# Patient Record
Sex: Female | Born: 2000 | Race: Black or African American | Hispanic: No | Marital: Single | Smoking: Former smoker
Health system: Southern US, Community
[De-identification: ages and names within clinical notes are randomized; demographics above are authoritative.]

## PROBLEM LIST (undated history)

## (undated) DIAGNOSIS — I10 Essential (primary) hypertension: Secondary | ICD-10-CM

## (undated) DIAGNOSIS — E119 Type 2 diabetes mellitus without complications: Secondary | ICD-10-CM

---
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
## 2018-10-13 ENCOUNTER — Encounter: Payer: Self-pay | Admitting: Emergency Medicine

## 2018-10-13 ENCOUNTER — Emergency Department: Payer: Self-pay

## 2018-10-13 ENCOUNTER — Other Ambulatory Visit: Payer: Self-pay

## 2018-10-13 ENCOUNTER — Emergency Department
Admission: EM | Admit: 2018-10-13 | Discharge: 2018-10-13 | Disposition: A | Discharge status: Home / Self Care | Payer: Self-pay | Attending: Emergency Medicine | Admitting: Emergency Medicine

## 2018-10-13 DIAGNOSIS — J45909 Unspecified asthma, uncomplicated: Secondary | ICD-10-CM | POA: Insufficient documentation

## 2018-10-13 DIAGNOSIS — Z794 Long term (current) use of insulin: Secondary | ICD-10-CM | POA: Insufficient documentation

## 2018-10-13 DIAGNOSIS — Z79899 Other long term (current) drug therapy: Secondary | ICD-10-CM | POA: Insufficient documentation

## 2018-10-13 DIAGNOSIS — E109 Type 1 diabetes mellitus without complications: Secondary | ICD-10-CM | POA: Insufficient documentation

## 2018-10-13 DIAGNOSIS — R0789 Other chest pain: Secondary | ICD-10-CM | POA: Insufficient documentation

## 2018-10-13 DIAGNOSIS — I10 Essential (primary) hypertension: Secondary | ICD-10-CM | POA: Insufficient documentation

## 2018-10-13 DIAGNOSIS — F17290 Nicotine dependence, other tobacco product, uncomplicated: Secondary | ICD-10-CM | POA: Insufficient documentation

## 2018-10-13 LAB — BASIC METABOLIC PANEL
ANION GAP: 9 (ref 5–15)
BUN: 13 mg/dL (ref 6–20)
CALCIUM: 9.7 mg/dL (ref 8.9–10.3)
CO2: 27 mmol/L (ref 22–32)
Chloride: 97 mmol/L — ABNORMAL LOW (ref 98–111)
Creatinine, Ser: 0.47 mg/dL (ref 0.44–1.00)
GLUCOSE: 311 mg/dL — AB (ref 70–99)
POTASSIUM: 4.1 mmol/L (ref 3.5–5.1)
SODIUM: 133 mmol/L — AB (ref 135–145)

## 2018-10-13 LAB — CBC
HCT: 45.5 % (ref 36.0–46.0)
HEMOGLOBIN: 14.7 g/dL (ref 12.0–15.0)
MCH: 25.7 pg — ABNORMAL LOW (ref 26.0–34.0)
MCHC: 32.3 g/dL (ref 30.0–36.0)
MCV: 79.4 fL — ABNORMAL LOW (ref 80.0–100.0)
NRBC: 0 % (ref 0.0–0.2)
Platelets: 400 10*3/uL (ref 150–400)
RBC: 5.73 MIL/uL — AB (ref 3.87–5.11)
RDW: 11.7 % (ref 11.5–15.5)
WBC: 5.4 10*3/uL (ref 4.0–10.5)

## 2018-10-13 LAB — POCT PREGNANCY, URINE: PREG TEST UR: NEGATIVE

## 2018-10-13 LAB — TROPONIN I

## 2018-10-13 MED ORDER — NAPROXEN 500 MG PO TABS: 500.0000 mg | ORAL_TABLET | Freq: Two times a day (BID) | ORAL | 0 refills | Status: AC

## 2018-10-13 NOTE — ED Triage Notes (Signed)
First Nurse Note:  C/O intermittent chest pain with inspiration and intermittent heart palpitations x 3-4 days.  Patient is AAOx3.  Skin warm and dry. NAD.  No SOB/ DOE.

## 2018-10-13 NOTE — ED Triage Notes (Signed)
Pt presents with cp x 4 days, intermittent, accompanied by sob. Denies n/v. Pt states she thought it would go away, but it kept returning. Pt alert & oriented, nad noted.

## 2018-10-13 NOTE — ED Provider Notes (Signed)
[facility] Emergency Department Provider Note  ____________________________________________  Time seen: Approximately 10:07 AM  I have reviewed the triage vital signs and the nursing notes.   HISTORY  Chief Complaint Chest Pain    HPI Colleen Murray is a 17 y.o. female with a history of hypertension and diabetes who  complains of right-sided chest pain for the past 4 days.  Intermittent, lasting about a minute at a time.  Described as sharp.  Nonradiating.  No aggravating or alleviating factors.  Not exertional, not pleuritic.  No associated shortness of breath diaphoresis or vomiting.  No dizziness or syncope.  No palpitations.  Has not tried anything for the pain.  No recent illness cough cold congestion sore throat runny nose.  Compliant with all her medications.  Follows up with endocrinology for her chronic diabetes care.  No recent travel trauma hospitalization or surgery.   Past Medical History:  Diagnosis Date  . Diabetes mellitus without complication (HCC)   . Hypertension      Patient Active Problem List   Diagnosis Date Noted  . Asthma, exercise induced 05/18/2015  . Type 2 diabetes mellitus with hyperglycemia (HCC) 05/18/2015  . Benign hypertension 05/18/2015     History reviewed. No pertinent surgical history.   Prior to Admission medications   Medication Sig Start Date End Date Taking? Authorizing Provider  albuterol (PROVENTIL HFA;VENTOLIN HFA) 108 (90 BASE) MCG/ACT inhaler Inhale 2 puffs into the lungs every 6 (six) hours as needed (before activity). 05/18/15  Yes Alba Cory, MD  GLUCAGON EMERGENCY 1 MG injection as needed. 03/01/15  Yes [provider]  hydrochlorothiazide (HYDRODIURIL) 12.5 MG tablet Take 1 tablet (12.5 mg total) by mouth 2 (two) times daily. 05/18/15  Yes Sowles, Danna Hefty, MD  metFORMIN (GLUCOPHAGE) 500 MG tablet Take 500 mg by mouth 2 (two) times daily with a meal. 04/25/15  Yes [provider]  NOVOLOG MIX 70/30 FLEXPEN (70-30) 100 UNIT/ML FlexPen Inject 25 Units as directed daily. And 45units at bedtime 02/28/15  Yes [provider]  ACCU-CHEK AVIVA PLUS test strip  02/28/15   [provider]  naproxen (NAPROSYN) 500 MG tablet Take 1 tablet (500 mg total) by mouth 2 (two) times daily with a meal. 10/13/18   Sharman Cheek, MD     Allergies Patient has no known allergies.   History reviewed. No pertinent family history.  Social History Social History   Tobacco Use  . Smoking status: Current Every Day Smoker    Types: Cigars  . Smokeless tobacco: Never Used  Substance Use Topics  . Alcohol use: Yes    Alcohol/week: 0.0 standard drinks    Comment: approx 2 shots per week  . Drug use: Yes    Frequency: 7.0 times per week    Types: Marijuana    Review of Systems  Constitutional:   No fever or chills.  ENT:   No sore throat. No rhinorrhea. Cardiovascular:   Positive as above chest pain without syncope. Respiratory:   No dyspnea or cough. Gastrointestinal:   Negative for abdominal pain, vomiting and diarrhea.  Musculoskeletal:   Negative for focal pain or swelling All other systems reviewed and are negative except as documented above in ROS and HPI.  ____________________________________________   PHYSICAL EXAM:  VITAL SIGNS: ED Triage Vitals  Enc Vitals Group     BP 10/13/18 0816 (!) 147/101     Pulse Rate 10/13/18 0816 96     Resp 10/13/18 0816 18  Temp 10/13/18 0816 98.4 F (36.9 C)     Temp Source 10/13/18 0816 Oral     SpO2 10/13/18 0816 98 %     Weight 10/13/18 0811 180 lb (81.6 kg)     Height 10/13/18 0811 5\' 3"  (1.6 m)     Head Circumference --      Peak Flow --      Pain Score 10/13/18 0811 7     Pain Loc --      Pain Edu? --      Excl. in GC? --     Vital signs reviewed, nursing assessments reviewed.   Constitutional:   Alert and oriented. Non-toxic appearance. Eyes:   Conjunctivae are normal. EOMI. PERRL. ENT       Head:   Normocephalic and atraumatic.      Nose:   No congestion/rhinnorhea.       Mouth/Throat:   MMM, no pharyngeal erythema. No peritonsillar mass.       Neck:   No meningismus. Full ROM. Hematological/Lymphatic/Immunilogical:   No cervical lymphadenopathy. Cardiovascular:   RRR. Symmetric bilateral radial and DP pulses.  No murmurs. Cap refill less than 2 seconds. Respiratory:   Normal respiratory effort without tachypnea/retractions. Breath sounds are clear and equal bilaterally. No wheezes/rales/rhonchi.  No inducible wheezing with FEV1 maneuver. Gastrointestinal:   Soft and nontender. Non distended. There is no CVA tenderness.  No rebound, rigidity, or guarding. Musculoskeletal:   Normal range of motion in all extremities. No joint effusions.  No lower extremity tenderness.  No edema.  Chest wall nontender. Neurologic:   Normal speech and language.  Motor grossly intact. No acute focal neurologic deficits are appreciated.  Skin:    Skin is warm, dry and intact. No rash noted.  No petechiae, purpura, or bullae.  ____________________________________________    LABS (pertinent positives/negatives) (all labs ordered are listed, but only abnormal results are displayed) Labs Reviewed  BASIC METABOLIC PANEL - Abnormal; Notable for the following components:      Result Value   Sodium 133 (*)    Chloride 97 (*)    Glucose, Bld 311 (*)    All other components within normal limits  CBC - Abnormal; Notable for the following components:   RBC 5.73 (*)    MCV 79.4 (*)    MCH 25.7 (*)    All other components within normal limits  TROPONIN I  POC URINE PREG, ED  POCT PREGNANCY, URINE   ____________________________________________   EKG  Interpreted by me Normal sinus rhythm rate of 95, normal axis intervals.  Poor R wave progression.  Normal ST segments and T waves.  ____________________________________________    RADIOLOGY  Dg Chest 2 View  Result Date:  10/13/2018 CLINICAL DATA:  Chest pain and cough EXAM: CHEST - 2 VIEW COMPARISON:  May 31, 2014 FINDINGS: The lungs are clear. The heart size and pulmonary vascularity are normal. No adenopathy. No bone lesions. No pneumothorax. IMPRESSION: No edema or consolidation. Electronically Signed   By: Bretta BangWilliam  Woodruff III M.D.   On: 10/13/2018 09:00    ____________________________________________   PROCEDURES Procedures  ____________________________________________  DIFFERENTIAL DIAGNOSIS   Atypical chest pain, chest wall pain, bronchitis  CLINICAL IMPRESSION / ASSESSMENT AND PLAN / ED COURSE  Pertinent labs & imaging results that were available during my care of the patient were reviewed by me and considered in my medical decision making (see chart for details).    Patient presents with atypical chest pain.Considering the patient's symptoms, medical history,  and physical examination today, I have low suspicion for ACS, PE, TAD, pneumothorax, carditis, mediastinitis, pneumonia, CHF, or sepsis.  EKG is unremarkable.  With her medical history, labs are obtained for further risk stratification.  Troponin is negative.  Chest x-ray is also unremarkable without evidence of pneumothorax or pneumonia.  Stable for discharge home and outpatient follow-up with primary care at this time.  Continue medications.  Does not appear to be dehydrated.  She is nontoxic.  Tolerating oral intake.  Clinical Course as of Oct 13 1006  Wed Oct 13, 2018  1610 Diabetes, on medications.  No evidence of acidosis.  Glucose(!): 311 [PS]    Clinical Course User Index [PS] Sharman Cheek, MD     ____________________________________________   FINAL CLINICAL IMPRESSION(S) / ED DIAGNOSES    Final diagnoses:  Atypical chest pain  Type 1 diabetes mellitus without complication [facility])     ED Discharge Orders         Ordered    naproxen (NAPROSYN) 500 MG tablet  2 times daily with meals     10/13/18 1007           Portions of this note were generated with dragon dictation software. Dictation errors may occur despite best attempts at proofreading.    Sharman Cheek, MD 10/13/18 1012

## 2018-10-13 NOTE — Discharge Instructions (Signed)
Your EKG, chest x-ray, and labs today were all okay.  It is unclear what is causing your chest pain but it does not appear to be anything serious.  Please follow-up with your doctor to continue monitoring the symptoms.

## 2019-06-13 ENCOUNTER — Ambulatory Visit: Payer: Self-pay

## 2019-09-07 IMAGING — CR DG CHEST 2V
2 series · 2 of 2 positions shown · non-contrast
Comparison: May 31, 2014

CLINICAL DATA: Chest pain and cough

EXAM:
CHEST - 2 VIEW

[chest pa]
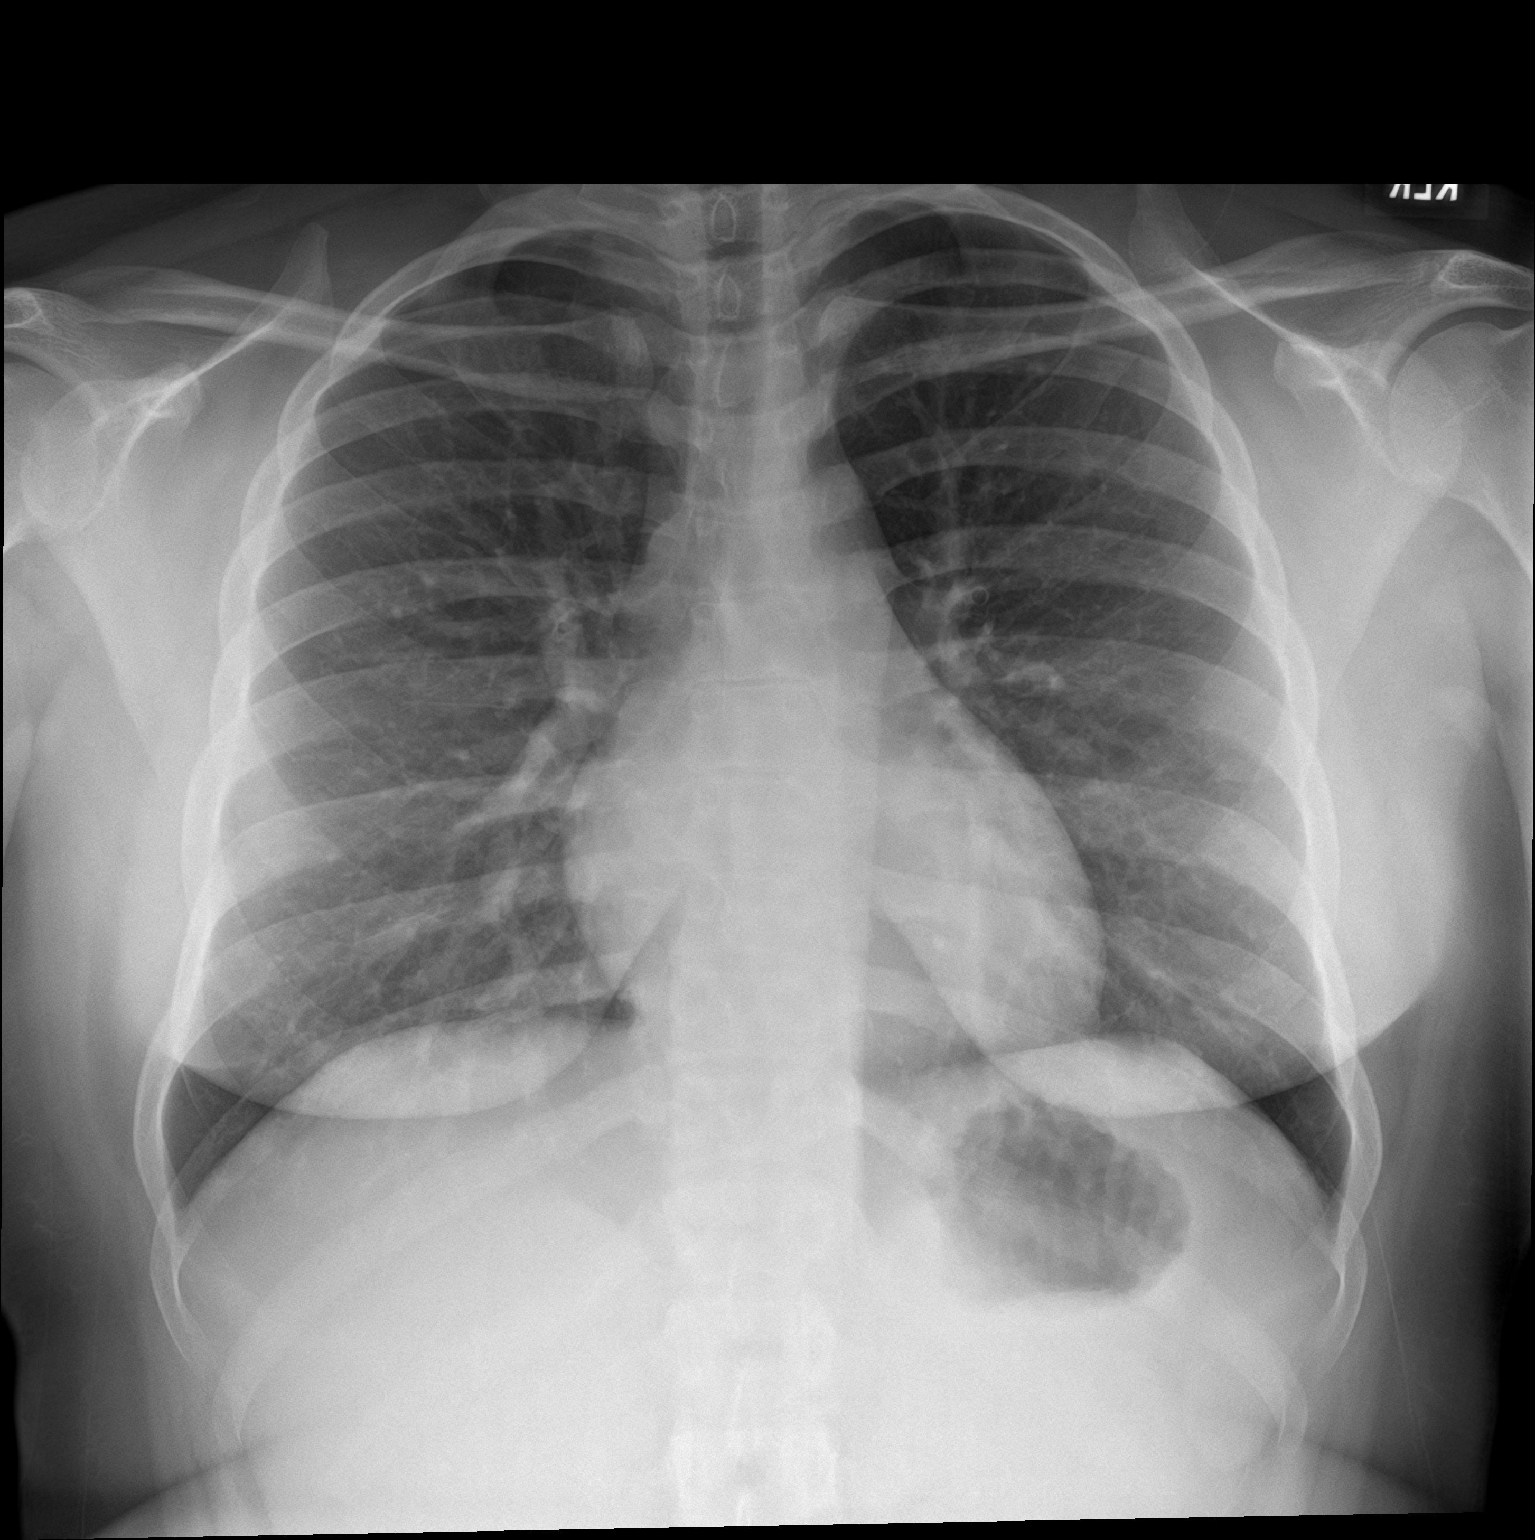

[chest lat]
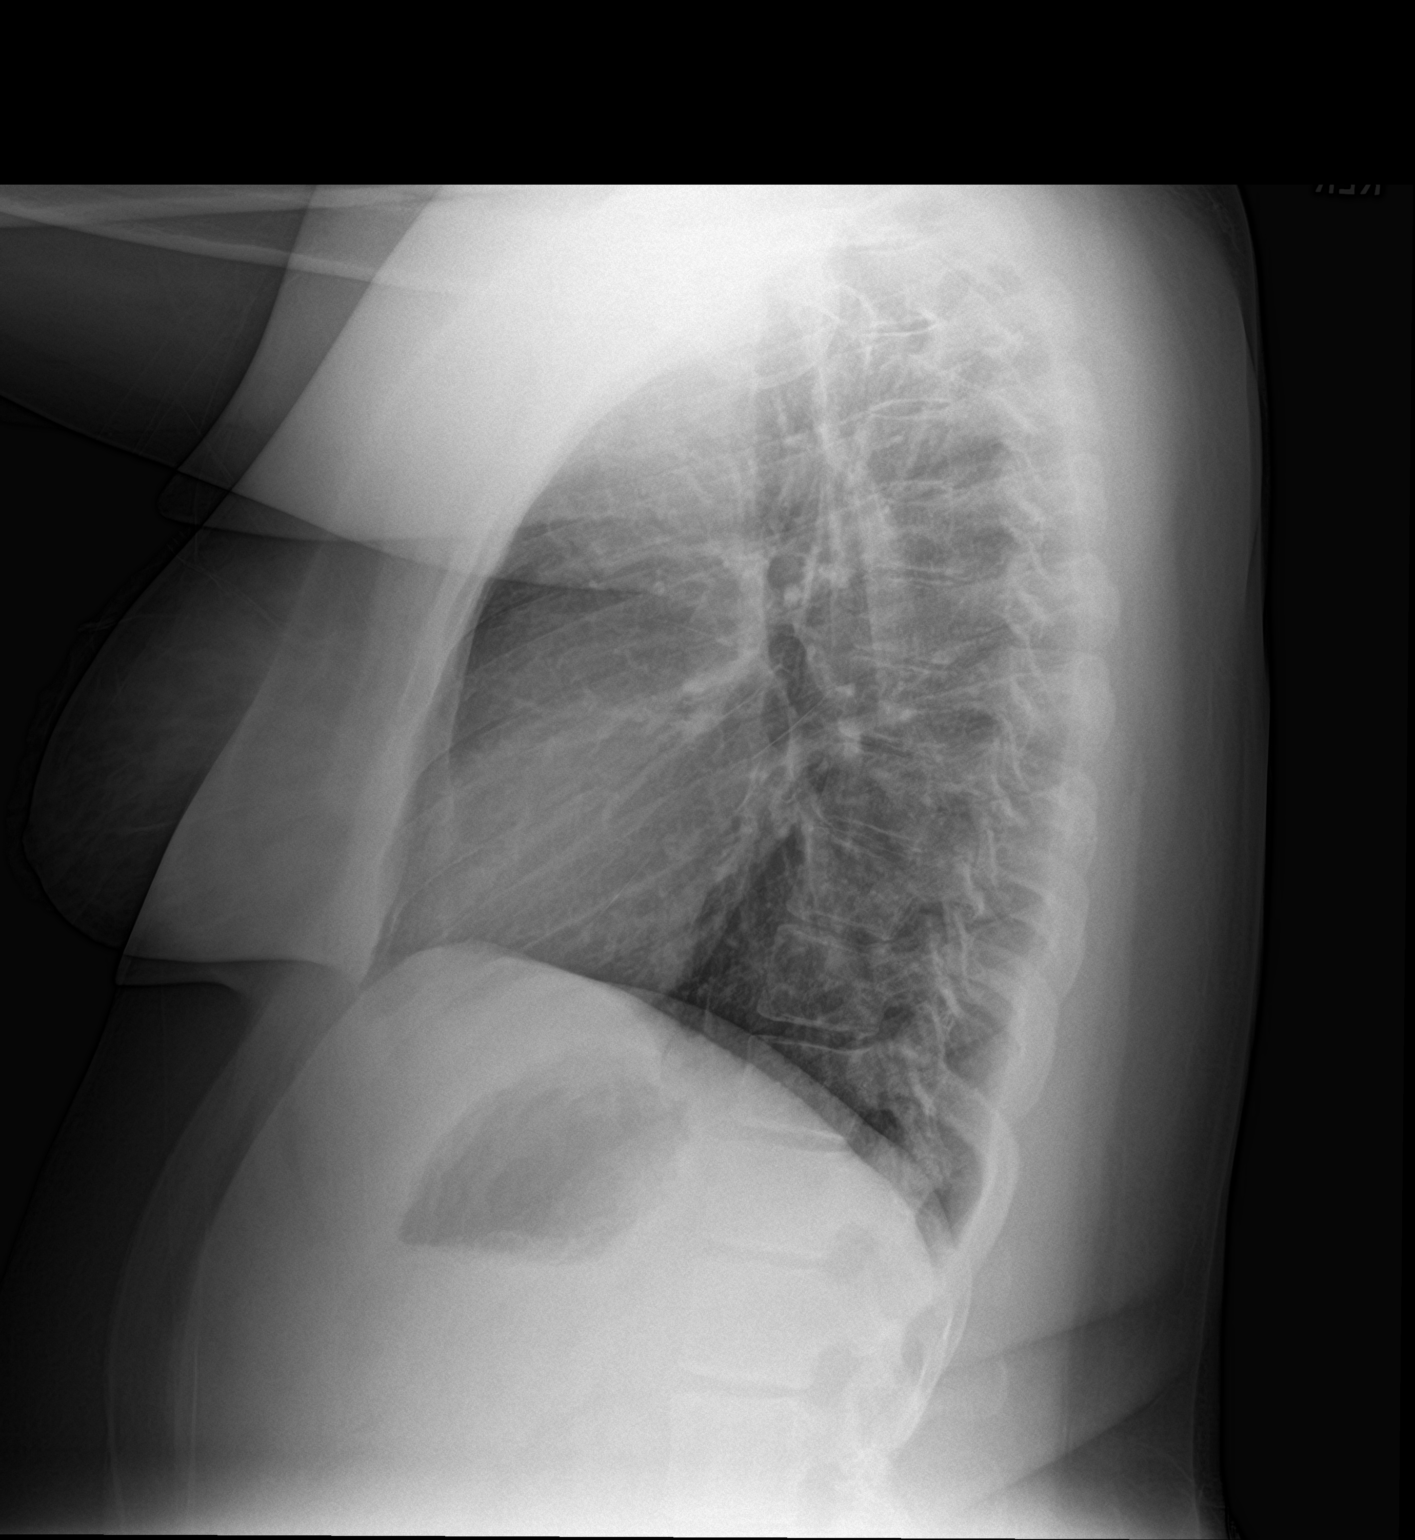

[2 of 2 positions shown; findings below may reference images not displayed]

FINDINGS: The lungs are clear. The heart size and pulmonary vascularity are
normal. No adenopathy. No bone lesions. No pneumothorax.
IMPRESSION: No edema or consolidation.

## 2021-10-19 ENCOUNTER — Other Ambulatory Visit: Payer: Self-pay

## 2021-10-19 ENCOUNTER — Encounter: Payer: Self-pay | Admitting: Intensive Care

## 2021-10-19 DIAGNOSIS — Z5321 Procedure and treatment not carried out due to patient leaving prior to being seen by health care provider: Secondary | ICD-10-CM | POA: Insufficient documentation

## 2021-10-19 DIAGNOSIS — R109 Unspecified abdominal pain: Secondary | ICD-10-CM | POA: Insufficient documentation

## 2021-10-19 LAB — COMPREHENSIVE METABOLIC PANEL
ALT: 12 U/L (ref 0–44)
AST: 15 U/L (ref 15–41)
Albumin: 4.5 g/dL (ref 3.5–5.0)
Alkaline Phosphatase: 76 U/L (ref 38–126)
Anion gap: 10 (ref 5–15)
BUN: 12 mg/dL (ref 6–20)
CO2: 25 mmol/L (ref 22–32)
Calcium: 9.3 mg/dL (ref 8.9–10.3)
Chloride: 99 mmol/L (ref 98–111)
Creatinine, Ser: 0.44 mg/dL (ref 0.44–1.00)
GFR, Estimated: >60 mL/min (ref >60)
Glucose, Bld: 321 mg/dL — ABNORMAL HIGH (ref 70–99)
Potassium: 3.5 mmol/L (ref 3.5–5.1)
Sodium: 134 mmol/L — ABNORMAL LOW (ref 135–145)
Total Bilirubin: 0.9 mg/dL (ref 0.3–1.2)
Total Protein: 8.5 g/dL — ABNORMAL HIGH (ref 6.5–8.1)

## 2021-10-19 LAB — CBC
HCT: 44.5 % (ref 36.0–46.0)
Hemoglobin: 14.1 g/dL (ref 12.0–15.0)
MCH: 25.5 pg — ABNORMAL LOW (ref 26.0–34.0)
MCHC: 31.7 g/dL (ref 30.0–36.0)
MCV: 80.5 fL (ref 80.0–100.0)
Platelets: 378 10*3/uL (ref 150–400)
RBC: 5.53 MIL/uL — ABNORMAL HIGH (ref 3.87–5.11)
RDW: 11.7 % (ref 11.5–15.5)
WBC: 5.2 10*3/uL (ref 4.0–10.5)
nRBC: 0 % (ref 0.0–0.2)

## 2021-10-19 LAB — LIPASE, BLOOD: Lipase: 31 U/L (ref 11–51)

## 2021-10-19 NOTE — ED Triage Notes (Signed)
Patient presents with abdominal pain. Reports being on menstrual cycle at this time. BLeeding heavy with clots per patient

## 2021-10-20 ENCOUNTER — Emergency Department
Admission: EM | Admit: 2021-10-20 | Discharge: 2021-10-20 | Disposition: A | Discharge status: Home / Self Care | Payer: Self-pay | Attending: Emergency Medicine | Admitting: Emergency Medicine

## 2021-10-21 ENCOUNTER — Other Ambulatory Visit: Payer: Self-pay

## 2022-12-23 ENCOUNTER — Other Ambulatory Visit: Payer: Self-pay

## 2022-12-23 ENCOUNTER — Encounter: Payer: Self-pay | Admitting: Emergency Medicine

## 2022-12-23 ENCOUNTER — Emergency Department
Admission: EM | Admit: 2022-12-23 | Discharge: 2022-12-23 | Disposition: A | Discharge status: Home / Self Care | Payer: 59 | Attending: Emergency Medicine | Admitting: Emergency Medicine

## 2022-12-23 ENCOUNTER — Emergency Department: Payer: 59

## 2022-12-23 DIAGNOSIS — Y92410 Unspecified street and highway as the place of occurrence of the external cause: Secondary | ICD-10-CM | POA: Insufficient documentation

## 2022-12-23 DIAGNOSIS — S161XXA Strain of muscle, fascia and tendon at neck level, initial encounter: Secondary | ICD-10-CM | POA: Insufficient documentation

## 2022-12-23 DIAGNOSIS — S199XXA Unspecified injury of neck, initial encounter: Secondary | ICD-10-CM | POA: Diagnosis not present

## 2022-12-23 DIAGNOSIS — S0990XA Unspecified injury of head, initial encounter: Secondary | ICD-10-CM | POA: Diagnosis not present
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
## 2023-01-27 ENCOUNTER — Emergency Department
Admission: EM | Admit: 2023-01-27 | Discharge: 2023-01-27 | Disposition: A | Discharge status: Home / Self Care | Payer: 59 | Attending: Emergency Medicine | Admitting: Emergency Medicine

## 2023-01-27 ENCOUNTER — Other Ambulatory Visit: Payer: Self-pay

## 2023-01-27 DIAGNOSIS — J45909 Unspecified asthma, uncomplicated: Secondary | ICD-10-CM | POA: Diagnosis not present

## 2023-01-27 DIAGNOSIS — I10 Essential (primary) hypertension: Secondary | ICD-10-CM | POA: Diagnosis not present

## 2023-01-27 DIAGNOSIS — Z760 Encounter for issue of repeat prescription: Secondary | ICD-10-CM | POA: Diagnosis not present

## 2023-01-27 DIAGNOSIS — E1165 Type 2 diabetes mellitus with hyperglycemia: Secondary | ICD-10-CM | POA: Insufficient documentation

## 2023-01-27 LAB — CBG MONITORING, ED: Glucose-Capillary: 340 mg/dL — ABNORMAL HIGH (ref 70–99)

## 2023-01-27 LAB — URINALYSIS, W/ REFLEX TO CULTURE (INFECTION SUSPECTED)
Bilirubin Urine: NEGATIVE
Glucose, UA: >=500 mg/dL — AB
Hgb urine dipstick: NEGATIVE
Ketones, ur: 5 mg/dL — AB
Leukocytes,Ua: NEGATIVE
Nitrite: NEGATIVE
Protein, ur: 100 mg/dL — AB
Specific Gravity, Urine: 1.037 — ABNORMAL HIGH (ref 1.005–1.030)
pH: 6 (ref 5.0–8.0)

## 2023-01-27 LAB — BASIC METABOLIC PANEL
Anion gap: 13 (ref 5–15)
BUN: 11 mg/dL (ref 6–20)
CO2: 22 mmol/L (ref 22–32)
Calcium: 9.2 mg/dL (ref 8.9–10.3)
Chloride: 100 mmol/L (ref 98–111)
Creatinine, Ser: 0.48 mg/dL (ref 0.44–1.00)
GFR, Estimated: >60 mL/min (ref >60)
Glucose, Bld: 312 mg/dL — ABNORMAL HIGH (ref 70–99)
Potassium: 3.9 mmol/L (ref 3.5–5.1)
Sodium: 135 mmol/L (ref 135–145)

## 2023-01-27 LAB — BLOOD GAS, VENOUS
Acid-Base Excess: 2.5 mmol/L — ABNORMAL HIGH (ref 0.0–2.0)
Bicarbonate: 27.9 mmol/L (ref 20.0–28.0)
O2 Saturation: 78.1 %
Patient temperature: 37
pCO2, Ven: 45 mmHg (ref 44–60)
pH, Ven: 7.4 (ref 7.25–7.43)
pO2, Ven: 47 mmHg — ABNORMAL HIGH (ref 32–45)

## 2023-01-27 LAB — CBC WITH DIFFERENTIAL/PLATELET
Abs Immature Granulocytes: 0.01 10*3/uL (ref 0.00–0.07)
Basophils Absolute: 0.1 10*3/uL (ref 0.0–0.1)
Basophils Relative: 1 %
Eosinophils Absolute: 0 10*3/uL (ref 0.0–0.5)
Eosinophils Relative: 0 %
HCT: 42.1 % (ref 36.0–46.0)
Hemoglobin: 13.2 g/dL (ref 12.0–15.0)
Immature Granulocytes: 0 %
Lymphocytes Relative: 32 %
Lymphs Abs: 1.8 10*3/uL (ref 0.7–4.0)
MCH: 25.3 pg — ABNORMAL LOW (ref 26.0–34.0)
MCHC: 31.4 g/dL (ref 30.0–36.0)
MCV: 80.7 fL (ref 80.0–100.0)
Monocytes Absolute: 0.3 10*3/uL (ref 0.1–1.0)
Monocytes Relative: 5 %
Neutro Abs: 3.6 10*3/uL (ref 1.7–7.7)
Neutrophils Relative %: 62 %
Platelets: 405 10*3/uL — ABNORMAL HIGH (ref 150–400)
RBC: 5.22 MIL/uL — ABNORMAL HIGH (ref 3.87–5.11)
RDW: 11.5 % (ref 11.5–15.5)
WBC: 5.8 10*3/uL (ref 4.0–10.5)
nRBC: 0 % (ref 0.0–0.2)

## 2023-01-27 LAB — POC URINE PREG, ED: Preg Test, Ur: NEGATIVE

## 2023-01-27 LAB — BETA-HYDROXYBUTYRIC ACID: Beta-Hydroxybutyric Acid: 0.49 mmol/L — ABNORMAL HIGH (ref 0.05–0.27)

## 2023-01-27 MED ORDER — LANCETS MISC. MISC: 1.0000 | Freq: Three times a day (TID) | 0 refills | Status: AC

## 2023-01-27 MED ORDER — SODIUM CHLORIDE 0.9 % IV BOLUS
1000.0000 mL | Freq: Once | INTRAVENOUS | Status: AC
  Administered 2023-01-27: 1000 mL via INTRAVENOUS

## 2023-01-27 MED ORDER — LANCET DEVICE MISC: 1.0000 | Freq: Three times a day (TID) | 0 refills | Status: AC

## 2023-01-27 MED ORDER — BLOOD GLUCOSE MONITORING SUPPL DEVI: 1.0000 | Freq: Three times a day (TID) | 0 refills | Status: AC

## 2023-01-27 MED ORDER — BLOOD GLUCOSE TEST VI STRP: 1.0000 | ORAL_STRIP | Freq: Three times a day (TID) | 0 refills | Status: AC

## 2023-01-27 MED ORDER — NOVOLOG MIX 70/30 FLEXPEN (70-30) 100 UNIT/ML ~~LOC~~ SUPN: 25.0000 [IU] | PEN_INJECTOR | Freq: Every day | SUBCUTANEOUS | 0 refills | Status: AC

## 2023-01-27 NOTE — ED Triage Notes (Addendum)
Pt comes with c/o medication refill of insulin. Pt sates glucometer is also messed up. Pt states some nauseated.   Pt states headache as well. Pt states she hasn't checked her sugar in awhile.  Pt states mom is diabetic and she has used some her medication since she has been out.

## 2023-01-27 NOTE — Discharge Instructions (Signed)
Your meds are refilled for you at the same dose as your previous doses.  Please follow-up with your outpatient provider for further glucose control and insulin management.  Please return for any new, worsening, or changing symptoms or other concerns.  Please pay close attention to your close with a glucose monitor.  It was a pleasure caring for you today.

## 2023-01-27 NOTE — ED Provider Notes (Signed)
[facility] Provider Note    Event Date/Time   First MD Initiated Contact with Patient 01/27/23 1142     (approximate)   History   Medication Refill   HPI  Colleen Murray is a 22 y.o. female with a past medical history of type 2 diabetes, asthma, hypertension who presents today with request for medication refill.  Patient reports that she has been out of her insulin for several months, and was using her mother's insulin until approximately 1 month ago.  She has not taken any insulin in the past 1 month.  She reports that she has been feeling nauseated, though no abdominal pain or vomiting.  She reports that she has intermittent dizziness.  No fevers or chills.  No other complaints today.  Patient Active Problem List   Diagnosis Date Noted   Asthma, exercise induced 05/18/2015   Type 2 diabetes mellitus with hyperglycemia ([place]) 05/18/2015   Benign hypertension 05/18/2015          Physical Exam   Triage Vital Signs: ED Triage Vitals  Enc Vitals Group     BP 01/27/23 1140 (!) 151/108     Pulse Rate 01/27/23 1140 (!) 105     Resp 01/27/23 1140 18     Temp 01/27/23 1140 98.5 F (36.9 C)     Temp src --      SpO2 01/27/23 1140 96 %     Weight --      Height --      Head Circumference --      Peak Flow --      Pain Score 01/27/23 1139 6     Pain Loc --      Pain Edu? --      Excl. in [place]? --     Most recent vital signs: Vitals:   01/27/23 1140  BP: (!) 151/108  Pulse: (!) 105  Resp: 18  Temp: 98.5 F (36.9 C)  SpO2: 96%    Physical Exam Vitals and nursing note reviewed.  Constitutional:      General: Awake and alert. No acute distress.    Appearance: Normal appearance. The patient is overweight.  HENT:     Head: Normocephalic and atraumatic.     Mouth: Mucous membranes are moist.  Eyes:     General: PERRL. Normal EOMs        Right eye: No discharge.        Left eye: No discharge.     Conjunctiva/sclera: Conjunctivae  normal.  Cardiovascular:     Rate and Rhythm: Normal rate and regular rhythm.     Pulses: Normal pulses.  Pulmonary:     Effort: Pulmonary effort is normal. No respiratory distress.     Breath sounds: Normal breath sounds.  Abdominal:     Abdomen is soft. There is no abdominal tenderness. No rebound or guarding. No distention. Musculoskeletal:        General: No swelling. Normal range of motion.     Cervical back: Normal range of motion and neck supple.  Skin:    General: Skin is warm and dry.     Capillary Refill: Capillary refill takes less than 2 seconds.     Findings: No rash.  Neurological:     Mental Status: The patient is awake and alert.      ED Results / Procedures / Treatments   Labs (all labs ordered are listed, but only abnormal results are displayed) Labs Reviewed  BASIC METABOLIC  PANEL - Abnormal; Notable for the following components:      Result Value   Glucose, Bld 312 (*)    All other components within normal limits  CBC WITH DIFFERENTIAL/PLATELET - Abnormal; Notable for the following components:   RBC 5.22 (*)    MCH 25.3 (*)    Platelets 405 (*)    All other components within normal limits  BLOOD GAS, VENOUS - Abnormal; Notable for the following components:   pO2, Ven 47 (*)    Acid-Base Excess 2.5 (*)    All other components within normal limits  BETA-HYDROXYBUTYRIC ACID - Abnormal; Notable for the following components:   Beta-Hydroxybutyric Acid 0.49 (*)    All other components within normal limits  URINALYSIS, W/ REFLEX TO CULTURE (INFECTION SUSPECTED) - Abnormal; Notable for the following components:   Color, Urine YELLOW (*)    APPearance HAZY (*)    Specific Gravity, Urine 1.037 (*)    Glucose, UA >=500 (*)    Ketones, ur 5 (*)    Protein, ur 100 (*)    Bacteria, UA RARE (*)    All other components within normal limits  CBG MONITORING, ED - Abnormal; Notable for the following components:   Glucose-Capillary 340 (*)    All other components  within normal limits  POC URINE PREG, ED     EKG     RADIOLOGY     PROCEDURES:  Critical Care performed:   Procedures   MEDICATIONS ORDERED IN ED: Medications  sodium chloride 0.9 % bolus 1,000 mL (0 mLs Intravenous Stopped 01/27/23 1340)     IMPRESSION / MDM / ASSESSMENT AND PLAN / ED COURSE  I reviewed the triage vital signs and the nursing notes.   Differential diagnosis includes, but is not limited to, medication refill, hyperglycemia, DKA, HHS,, electrolyte disarray.  Patient is awake and alert, mildly tachycardic and hypertensive on arrival, though nontoxic in appearance.  Blood sugar check obtained in triage was 340.  Given her nausea in the setting of medication noncompliance, will obtain blood work for evaluation of potential DKA.  Patient has hyperglycemia, however no increased anion gap, normal bicarb, normal pH, not consistent with DKA.  No altered mental status, currently asymptomatic.  She was treated with IV fluids, and her insulin was refilled for her per her previous dosage.  Recommended close outpatient follow-up with her PCP for continued refills and insulin management to ensure that this is the proper dose for her.  She is also given a glucometer prescription with test trips and she reports that she is very accustomed to checking her blood sugar and adjusting her insulin as needed.  We discussed very strict return precautions and the importance of close outpatient follow-up.  Patient understands and agrees with plan.  She was discharged in stable condition.   Patient's presentation is most consistent with acute presentation with potential threat to life or bodily function.      FINAL CLINICAL IMPRESSION(S) / ED DIAGNOSES   Final diagnoses:  Medication refill  Hyperglycemia due to diabetes mellitus ([place])     Rx / DC Orders   ED Discharge Orders          Ordered    NOVOLOG MIX 70/30 FLEXPEN (70-30) 100 UNIT/ML FlexPen  Daily        01/27/23 1343     Blood Glucose Monitoring Suppl DEVI  3 times daily        01/27/23 1343    Glucose Blood (BLOOD GLUCOSE TEST STRIPS)  STRP  3 times daily        01/27/23 1343    Lancet Device MISC  3 times daily        01/27/23 1343    Lancets Misc. MISC  3 times daily        01/27/23 1343             Note:  This document was prepared using Dragon voice recognition software and may include unintentional dictation errors.   Emeline Gins 01/27/23 1404    Lavonia Drafts, MD 01/28/23 [phone]

## 2023-03-03 ENCOUNTER — Other Ambulatory Visit: Payer: Self-pay

## 2023-03-03 ENCOUNTER — Emergency Department
Admission: EM | Admit: 2023-03-03 | Discharge: 2023-03-03 | Disposition: A | Discharge status: Home / Self Care | Payer: 59 | Attending: Emergency Medicine | Admitting: Emergency Medicine

## 2023-03-03 DIAGNOSIS — K0889 Other specified disorders of teeth and supporting structures: Secondary | ICD-10-CM | POA: Insufficient documentation

## 2023-03-03 MED ORDER — AMOXICILLIN 875 MG PO TABS: 875.0000 mg | ORAL_TABLET | Freq: Two times a day (BID) | ORAL | 0 refills | Status: AC

## 2023-03-03 NOTE — ED Triage Notes (Signed)
Pt here with dental pain x 2 months. Pt states her pain is in the top back right side of her mouth. Pt does not currently have a dentist.

## 2023-03-03 NOTE — Discharge Instructions (Addendum)
Follow-up with the dental clinics listed on your discharge papers.  Begin taking the amoxicillin 875 mg twice daily until completely finished.  You may take Tylenol or ibuprofen as needed for pain.  OPTIONS FOR DENTAL FOLLOW UP CARE  [place] [facility] - Local Safety Net Dental Clinics [email or web address]   [facility] [phone])  [facility] [phone])  [place] [phone])  [facility] [phone])  [facility] [phone]) This clinic caters to the indigent population and is on a lottery system. Location: [employer] [facility], [employer], [address] [place] Clinic Hours: Wednesdays from 6pm - 9pm, patients seen by a lottery system. For dates, call or go to [email or web address] Services: Cleanings, fillings and simple extractions. Payment Options: DENTAL WORK IS FREE OF CHARGE. Bring proof of income or support. Best way to get seen: Arrive at 5:15 pm - this is a lottery, NOT first come/first serve, so arriving earlier will not increase your chances of being seen.     [facility] [phone] Select option 1 for emergencies   Location: [facility], [place], [address] [place] Clinic Hours: No walk-ins accepted - call the day before to schedule an appointment. Check in times are 9:30 am and 1:30 pm. Services: Simple extractions, temporary fillings, pulpectomy/pulp debridement, uncomplicated abscess drainage. Payment Options: PAYMENT IS DUE AT THE TIME OF SERVICE.  Fee is usually $100-200, additional surgical procedures (e.g. abscess drainage) may be extra. Cash, checks, Visa/MasterCard accepted.  Can file Medicaid if patient is covered for dental - patient should call case worker to check. No discount for [facility]  patients. Best way to get seen: MUST call the day before and get onto the schedule. Can usually be seen the next 1-2 days. No walk-ins accepted.     [facility] [phone]   Location: [facility], [address] [place] Clinic Hours: M, W, Th, F 8am or 1:30pm, Tues 9a or 1:30 - first come/first served. Services: Simple extractions, temporary fillings, uncomplicated abscess drainage.  You do not need to be an [facility] resident. Payment Options: PAYMENT IS DUE AT THE TIME OF SERVICE. Dental insurance, otherwise sliding scale - bring proof of income or support. Depending on income and treatment needed, cost is usually $50-200. Best way to get seen: Arrive early as it is first come/first served.     [facility] [phone]   Location: [address] Clinic Hours: Mon-Thu 8a-5p Services: Most basic dental services including extractions and fillings. Payment Options: PAYMENT IS DUE AT THE TIME OF SERVICE. Sliding scale, up to 50% off - bring proof if income or support. Medicaid with dental option accepted. Best way to get seen: Call to schedule an appointment, can usually be seen within 2 weeks OR they will try to see walk-ins - show up at 8a or 2p (you may have to wait).     [facility] [phone] [place] RESIDENTS ONLY   Location: [facility], [address] [place], [place] [address] Clinic Hours: By appointment only. Monday - Thursday 8am-5pm, Friday 8am-12pm Services: Cleanings, fillings, extractions. Payment Options: PAYMENT IS DUE AT THE TIME OF SERVICE. Cash, Visa or MasterCard. Sliding scale - $30 minimum per service. Best way to get seen: Come in to office, complete packet and make an appointment - need proof of income or support monies  for each household member and proof of [facility] residence. Usually takes about a month to  get in.     [facility] [phone]   Location: [address] [facility] Clinic Hours: Walk-in Urgent Care Dental Services are offered Monday-Friday mornings only. The numbers of emergencies accepted daily is limited to the number of providers available. Maximum 15 - Mondays, Wednesdays & Thursdays Maximum 10 - Tuesdays & Fridays Services: You do not need to be a [facility] resident to be seen for a dental emergency. Emergencies are defined as pain, swelling, abnormal bleeding, or dental trauma. Walkins will receive x-rays if needed. NOTE: Dental cleaning is not an emergency. Payment Options: PAYMENT IS DUE AT THE TIME OF SERVICE. Minimum co-pay is $40.00 for uninsured patients. Minimum co-pay is $3.00 for Medicaid with dental coverage. Dental Insurance is accepted and must be presented at time of visit. Medicare does not cover dental. Forms of payment: Cash, credit card, checks. Best way to get seen: If not previously registered with the clinic, walk-in dental registration begins at 7:15 am and is on a first come/first serve basis. If previously registered with the clinic, call to make an appointment.     [facility] [phone] [place] RESIDENTS ONLY   Location: [address] [place], [place] Clinic Hours: Mon-Thu 10a-2p Services: Extractions only! Payment Options: FREE (donations accepted) - bring proof of income or support Best way to get seen: Call and schedule an appointment OR come at 8am on the 1st Monday of every month (except for holidays) when it is first come/first served.     [facility] [phone]   Location: [address] [place], [place] Clinic Hours: Friday mornings Services, Payment Options, Best way to get seen: Call for info

## 2023-03-03 NOTE — ED Provider Notes (Signed)
[facility] Provider Note    None    (approximate)   History   Dental Pain   HPI  Colleen Murray is a 22 y.o. female   is here with complaint of dental pain for 2 months.  Patient states that the tooth to the top right side of her mouth has been hurting for 2 months off-and-on but more recently has become more frequent.  Patient does not have a dentist currently.      Physical Exam   Triage Vital Signs: ED Triage Vitals  Enc Vitals Group     BP 03/03/23 1002 (!) 148/97     Pulse Rate 03/03/23 1003 (!) 102     Resp 03/03/23 1002 16     Temp 03/03/23 1002 98.4 F (36.9 C)     Temp Source 03/03/23 1002 Oral     SpO2 03/03/23 1003 94 %     Weight 03/03/23 1003 169 lb 15.6 oz (77.1 kg)     Height 03/03/23 1003 5\' 4"  (1.626 m)     Head Circumference --      Peak Flow --      Pain Score 03/03/23 1003 8     Pain Loc --      Pain Edu? --      Excl. in [place]? --     Most recent vital signs: Vitals:   03/03/23 1002 03/03/23 1003  BP: (!) 148/97   Pulse:  (!) 102  Resp: 16   Temp: 98.4 F (36.9 C)   SpO2:  94%     General: Awake, no distress.  CV:  Good peripheral perfusion.  Resp:  Normal effort.  Abd:  No distention.  Other:  Right posterior molar tender with no obvious abscess formation or drainage noted.  Neck is supple without cervical lymphadenopathy.  There is tenderness on palpation of her face in relationship to this particular tooth.   ED Results / Procedures / Treatments   Labs (all labs ordered are listed, but only abnormal results are displayed) Labs Reviewed - No data to display    PROCEDURES:  Critical Care performed:   Procedures   MEDICATIONS ORDERED IN ED: Medications - No data to display   IMPRESSION / MDM / ASSESSMENT AND PLAN / ED COURSE  I reviewed the triage vital signs and the nursing notes.   Differential diagnosis includes, but is not limited to, dental pain, dental carry, dental abscess,  gingivitis.  22 year old female presents to the ED with complaint of dental pain for the last 2 months.  She was given a list of dental clinics in the area and also started on a prescription of amoxicillin 875 twice daily for 10 days.  She will continue with Tylenol or ibuprofen if needed for pain.      Patient's presentation is most consistent with acute complicated illness / injury requiring diagnostic workup.  FINAL CLINICAL IMPRESSION(S) / ED DIAGNOSES   Final diagnoses:  Pain, dental     Rx / DC Orders   ED Discharge Orders          Ordered    amoxicillin (AMOXIL) 875 MG tablet  2 times daily        03/03/23 1021             Note:  This document was prepared using Dragon voice recognition software and may include unintentional dictation errors.   Tommi Rumps, PA-C 03/03/23 1425    Jene Every, MD 03/03/23  1428  

## 2024-11-10 ENCOUNTER — Encounter: Payer: Self-pay | Admitting: *Deleted

## 2024-11-10 ENCOUNTER — Other Ambulatory Visit: Payer: Self-pay

## 2024-11-10 DIAGNOSIS — Z79899 Other long term (current) drug therapy: Secondary | ICD-10-CM | POA: Insufficient documentation

## 2024-11-10 DIAGNOSIS — Z7984 Long term (current) use of oral hypoglycemic drugs: Secondary | ICD-10-CM | POA: Insufficient documentation

## 2024-11-10 DIAGNOSIS — E119 Type 2 diabetes mellitus without complications: Secondary | ICD-10-CM | POA: Insufficient documentation

## 2024-11-10 DIAGNOSIS — I10 Essential (primary) hypertension: Secondary | ICD-10-CM | POA: Insufficient documentation

## 2024-11-10 DIAGNOSIS — Z0279 Encounter for issue of other medical certificate: Secondary | ICD-10-CM | POA: Insufficient documentation

## 2024-11-10 DIAGNOSIS — Z794 Long term (current) use of insulin: Secondary | ICD-10-CM | POA: Insufficient documentation
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
## 2024-11-11 ENCOUNTER — Emergency Department
Admission: EM | Admit: 2024-11-11 | Discharge: 2024-11-11 | Disposition: A | Payer: Self-pay | Attending: Emergency Medicine | Admitting: Emergency Medicine

## 2024-11-11 DIAGNOSIS — Z7689 Persons encountering health services in other specified circumstances: Secondary | ICD-10-CM

## 2024-11-11 NOTE — ED Notes (Signed)
 Pt declined discharge vitals

## 2024-11-11 NOTE — ED Provider Notes (Signed)
 "  [facility] Provider Note    Event Date/Time   First MD Initiated Contact with Patient 11/11/24 0136     (approximate)   History   Medical Clearance   HPI  Colleen Murray is a 23 y.o. female with history of hypertension, diabetes who presents to the emergency department for a work note.  States that she had fever of 103 two days ago without any other symptoms and has now been asymptomatic and not had any fever for over 24 hours and has not been using Tylenol  or ibuprofen but states that her job told her she had to come get checked out and get a note that she was safe to return to work.  No vomiting or diarrhea.  No urinary symptoms.  No cough, congestion, sore throat.   History provided by patient.    Past Medical History:  Diagnosis Date   Diabetes mellitus without complication (HCC)    Hypertension     History reviewed. No pertinent surgical history.  MEDICATIONS:  Prior to Admission medications  Medication Sig Start Date End Date Taking? Authorizing Provider  ACCU-CHEK AVIVA PLUS test strip  02/28/15   [provider]  albuterol  (PROVENTIL  HFA;VENTOLIN  HFA) 108 (90 BASE) MCG/ACT inhaler Inhale 2 puffs into the lungs every 6 (six) hours as needed (before activity). 05/18/15   Sowles, Krichna, MD  amoxicillin  (AMOXIL ) 875 MG tablet Take 1 tablet (875 mg total) by mouth 2 (two) times daily. 03/03/23   Saunders Shona CROME, PA-C  Blood Glucose Monitoring Suppl DEVI 1 each by Does not apply route in the morning, at noon, and at bedtime. May substitute to any manufacturer covered by patient's insurance. 01/27/23   Poggi, Jenna E, PA-C  GLUCAGON EMERGENCY 1 MG injection as needed. 03/01/15   [provider]  hydrochlorothiazide  (HYDRODIURIL ) 12.5 MG tablet Take 1 tablet (12.5 mg total) by mouth 2 (two) times daily. 05/18/15   Sowles, Krichna, MD  metFORMIN (GLUCOPHAGE) 500 MG tablet Take 500 mg by mouth 2 (two) times daily with a meal. 04/25/15    [provider]  naproxen  (NAPROSYN ) 500 MG tablet Take 1 tablet (500 mg total) by mouth 2 (two) times daily with a meal. 10/13/18   Viviann Pastor, MD  NOVOLOG  MIX 70/30 FLEXPEN (70-30) 100 UNIT/ML FlexPen Inject 25 Units into the skin daily. And 45units at bedtime 01/27/23   Poggi, Jenna E, PA-C    Physical Exam   Triage Vital Signs: ED Triage Vitals  Encounter Vitals Group     BP 11/10/24 2204 (!) 139/97     Girls Systolic BP Percentile --      Girls Diastolic BP Percentile --      Boys Systolic BP Percentile --      Boys Diastolic BP Percentile --      Pulse Rate 11/10/24 2204 100     Resp 11/10/24 2204 18     Temp 11/10/24 2204 98.6 F (37 C)     Temp Source 11/10/24 2204 Oral     SpO2 11/10/24 2204 95 %     Weight 11/10/24 2201 165 lb (74.8 kg)     Height 11/10/24 2201 5' 4 (1.626 m)     Head Circumference --      Peak Flow --      Pain Score --      Pain Loc --      Pain Education --      Exclude from Growth Chart --  Most recent vital signs: Vitals:   11/10/24 2204  BP: (!) 139/97  Pulse: 100  Resp: 18  Temp: 98.6 F (37 C)  SpO2: 95%    CONSTITUTIONAL: Alert, responds appropriately to questions. Well-appearing; well-nourished HEAD: Normocephalic, atraumatic EYES: Conjunctivae clear, pupils appear equal, sclera nonicteric ENT: normal nose; moist mucous membranes NECK: Supple, normal ROM CARD: RRR; S1 and S2 appreciated RESP: Normal chest excursion without splinting or tachypnea; breath sounds clear and equal bilaterally; no wheezes, no rhonchi, no rales, no hypoxia or respiratory distress, speaking full sentences ABD/GI: Non-distended; soft, non-tender, no rebound, no guarding, no peritoneal signs BACK: The back appears normal EXT: Normal ROM in all joints; no deformity noted, no edema SKIN: Normal color for age and race; warm; no rash on exposed skin NEURO: Moves all extremities equally, normal speech PSYCH: The patient's mood and manner  are appropriate.   ED Results / Procedures / Treatments   LABS: (all labs ordered are listed, but only abnormal results are displayed) Labs Reviewed - No data to display   EKG:  EKG Interpretation Date/Time:    Ventricular Rate:    PR Interval:    QRS Duration:    QT Interval:    QTC Calculation:   R Axis:      Text Interpretation:           RADIOLOGY: My personal review and interpretation of imaging:    I have personally reviewed all radiology reports.   No results found.   PROCEDURES:  Critical Care performed: No    Procedures    IMPRESSION / MDM / ASSESSMENT AND PLAN / ED COURSE  I reviewed the triage vital signs and the nursing notes.    Patient here for encounter for note to return to work.    DIFFERENTIAL DIAGNOSIS (includes but not limited to):   Encounter for work note, viral infection, doubt pneumonia, UTI, sepsis, bacteremia, meningitis   Patient's presentation is most consistent with acute, uncomplicated illness.   PLAN: Patient declines COVID, flu and RSV testing today.  States she is asymptomatic and has not had a fever in over 24 hours and has not been using Tylenol  or Motrin.  She is here specifically for a work note stating that she can go back to work today as instructed by her employer.  She has no acute complaints.  Will provide with work note and discharge.   MEDICATIONS GIVEN IN ED: Medications - No data to display   ED COURSE:  At this time, I do not feel there is any life-threatening condition present. I reviewed all nursing notes, vitals, pertinent previous records.  All lab and urine results, EKGs, imaging ordered have been independently reviewed and interpreted by myself.  I reviewed all available radiology reports from any imaging ordered this visit.  Based on my assessment, I feel the patient is safe to be discharged home without further emergent workup and can continue workup as an outpatient as needed. Discussed all  findings, treatment plan as well as usual and customary return precautions.  They verbalize understanding and are comfortable with this plan.  Outpatient follow-up has been provided as needed.  All questions have been answered.    CONSULTS:  none   OUTSIDE RECORDS REVIEWED: Reviewed last internal medicine notes in 2020, 2016.       FINAL CLINICAL IMPRESSION(S) / ED DIAGNOSES   Final diagnoses:  Return to work evaluation     Rx / DC Orders   ED Discharge Orders  None        Note:  This document was prepared using Dragon voice recognition software and may include unintentional dictation errors.   Amarachi Kotz, Josette SAILOR, DO 11/11/24 [phone]  "
